# Patient Record
Sex: Male | Born: 1965 | Race: Black or African American | Hispanic: No | Marital: Married | Smoking: Former smoker
Health system: Southern US, Community
[De-identification: ages and names within clinical notes are randomized; demographics above are authoritative.]

## PROBLEM LIST (undated history)

## (undated) DIAGNOSIS — J45909 Unspecified asthma, uncomplicated: Secondary | ICD-10-CM

## (undated) HISTORY — PX: MANDIBLE FRACTURE SURGERY: SHX706

## (undated) HISTORY — PX: KNEE SURGERY: SHX244

---

## 2018-02-08 ENCOUNTER — Encounter: Payer: Self-pay | Admitting: Emergency Medicine

## 2018-02-08 ENCOUNTER — Emergency Department
Admission: EM | Admit: 2018-02-08 | Discharge: 2018-02-08 | Disposition: A | Discharge status: Home / Self Care | Payer: Managed Care, Other (non HMO) | Attending: Emergency Medicine | Admitting: Emergency Medicine

## 2018-02-08 ENCOUNTER — Emergency Department: Payer: Managed Care, Other (non HMO)

## 2018-02-08 ENCOUNTER — Other Ambulatory Visit: Payer: Self-pay

## 2018-02-08 DIAGNOSIS — J45909 Unspecified asthma, uncomplicated: Secondary | ICD-10-CM | POA: Insufficient documentation

## 2018-02-08 DIAGNOSIS — M5430 Sciatica, unspecified side: Secondary | ICD-10-CM | POA: Diagnosis present

## 2018-02-08 DIAGNOSIS — Z87891 Personal history of nicotine dependence: Secondary | ICD-10-CM | POA: Insufficient documentation

## 2018-02-08 DIAGNOSIS — M5432 Sciatica, left side: Secondary | ICD-10-CM | POA: Diagnosis not present

## 2018-02-08 HISTORY — DX: Unspecified asthma, uncomplicated: J45.909

## 2018-02-08 MED ORDER — KETOROLAC TROMETHAMINE 10 MG PO TABS: 10.0000 mg | ORAL_TABLET | Freq: Four times a day (QID) | ORAL | 0 refills | Status: AC | PRN

## 2018-02-08 MED ORDER — METHOCARBAMOL 500 MG PO TABS: ORAL_TABLET | ORAL | 0 refills | Status: AC

## 2018-02-08 MED ORDER — KETOROLAC TROMETHAMINE 30 MG/ML IJ SOLN
30.0000 mg | Freq: Once | INTRAMUSCULAR | Status: AC
  Administered 2018-02-08: 30 mg via INTRAMUSCULAR
  Filled 2018-02-08: qty 1

## 2018-02-08 MED ORDER — HYDROCODONE-ACETAMINOPHEN 5-325 MG PO TABS: 1.0000 | ORAL_TABLET | Freq: Four times a day (QID) | ORAL | 0 refills | Status: AC | PRN

## 2018-02-08 MED ORDER — HYDROCODONE-ACETAMINOPHEN 5-325 MG PO TABS
1.0000 | ORAL_TABLET | Freq: Once | ORAL | Status: AC
  Administered 2018-02-08: 1 via ORAL
  Filled 2018-02-08: qty 1

## 2018-02-08 MED ORDER — METHOCARBAMOL 500 MG PO TABS
1000.0000 mg | ORAL_TABLET | Freq: Once | ORAL | Status: AC
  Administered 2018-02-08: 1000 mg via ORAL
  Filled 2018-02-08: qty 2

## 2018-02-08 NOTE — ED Provider Notes (Signed)
[facility] Emergency Department Provider Note  ____________________________________________   First MD Initiated Contact with Patient 02/08/18 1244     (approximate)  I have reviewed the triage vital signs and the nursing notes.   HISTORY  Chief Complaint Sciatica   HPI Christopher Ortega is a 52 y.o. male presents to the ED with complaint of left leg sciatica.  Patient states he was seen at urgent care almost 2 weeks ago and was diagnosed with sciatica.  He was given steroids and Motrin and has not improved much.  He denies any injury to his back.  He denies any incontinence of bowel or bladder and continues to be ambulatory with a limp.  He states that pain is better when he is lying flat with his leg elevated.  Patient is requesting a x-ray of his back.  Currently rates his pain as 7 out of 10.   Past Medical History:  Diagnosis Date  . Asthma     There are no active problems to display for this patient.   Past Surgical History:  Procedure Laterality Date  . KNEE SURGERY    . MANDIBLE FRACTURE SURGERY      Prior to Admission medications   Medication Sig Start Date End Date Taking? Authorizing Provider  HYDROcodone-acetaminophen (NORCO/VICODIN) 5-325 MG tablet Take 1 tablet by mouth every 6 (six) hours as needed for moderate pain. 02/08/18   Tommi RumpsSummers, Yanuel Tagg L, PA-C  ketorolac (TORADOL) 10 MG tablet Take 1 tablet (10 mg total) by mouth every 6 (six) hours as needed for moderate pain. 02/08/18   Tommi RumpsSummers, Titianna Loomis L, PA-C  methocarbamol (ROBAXIN) 500 MG tablet 1 or 2 every 6 hours if needed for muscle spasms. 02/08/18   Tommi RumpsSummers, Lisa-Marie Rueger L, PA-C    Allergies Patient has no known allergies.  History reviewed. No pertinent family history.  Social History Social History   Tobacco Use  . Smoking status: Former Games developermoker  . Smokeless tobacco: Never Used  Substance Use Topics  . Alcohol use: Yes  . Drug use: Never    Review of Systems Constitutional: No  fever/chills Eyes: No visual changes. ENT: No sore throat. Cardiovascular: Denies chest pain. Respiratory: Denies shortness of breath. Gastrointestinal: No abdominal pain.  No nausea, no vomiting.  No diarrhea.  No constipation. Genitourinary: Negative for dysuria. Musculoskeletal: Positive for left leg sciatica. Skin: Negative for rash. Neurological: Negative for headaches, focal weakness or numbness. ___________________________________________   PHYSICAL EXAM:  VITAL SIGNS: ED Triage Vitals  Enc Vitals Group     BP 02/08/18 1119 (!) 155/90     Pulse Rate 02/08/18 1119 80     Resp 02/08/18 1119 18     Temp 02/08/18 1119 (!) 97.4 F (36.3 C)     Temp Source 02/08/18 1119 Oral     SpO2 02/08/18 1119 97 %     Weight --      Height --      Head Circumference --      Peak Flow --      Pain Score 02/08/18 1118 7     Pain Loc --      Pain Edu? --      Excl. in GC? --    Constitutional: Alert and oriented. Well appearing and in no acute distress. Eyes: Conjunctivae are normal.  Head: Atraumatic. Neck: No stridor.   Cardiovascular: Normal rate, regular rhythm. Grossly normal heart sounds.  Good peripheral circulation. Respiratory: Normal respiratory effort.  No retractions. Lungs CTAB. Gastrointestinal: Soft  and nontender. No distention.  Musculoskeletal: Examination of the back there is no gross deformity noted.  There is no point tenderness on palpation of the lumbar spine.  There is however moderate amount of tenderness on palpation of the left SI joint area and surrounding tissue.  Straight leg raises were negative and patient has good muscle strength bilaterally.  Patient was observed walking in the hallway and does walk with a limp favoring his left leg. Neurologic:  Normal speech and language. No gross focal neurologic deficits are appreciated. No gait instability. Skin:  Skin is warm, dry and intact. No rash noted. Psychiatric: Mood and affect are normal. Speech and  behavior are normal.  ____________________________________________   LABS (all labs ordered are listed, but only abnormal results are displayed)  Labs Reviewed - No data to display  RADIOLOGY  ED MD interpretation:   Lumbar spine x-ray is negative for acute bony injury.  Official radiology report(s): Dg Lumbar Spine 2-3 Views  Result Date: 02/08/2018 CLINICAL DATA:  Left-sided sciatica 2 days ago. EXAM: LUMBAR SPINE - 2-3 VIEW COMPARISON:  None. FINDINGS: There is no evidence of lumbar spine fracture. Alignment is normal. Intervertebral disc spaces are maintained. IMPRESSION: Negative. Electronically Signed   By: Ted Mcalpine M.D.   On: 02/08/2018 14:09    ____________________________________________   PROCEDURES  Procedure(s) performed: None  Procedures  Critical Care performed: No  ____________________________________________   INITIAL IMPRESSION / ASSESSMENT AND PLAN / ED COURSE  As part of my medical decision making, I reviewed the following data within the electronic MEDICAL RECORD NUMBER Notes from prior ED visits and [place] Controlled Substance Database  Patient presents to the ED with complaint of left leg radiculopathy that was diagnosed as sciatica nearly 2 weeks ago.  Patient has taken a course of steroids without any relief.  Patient denies any injury to his back.  X-rays confirmed that there was no bony injury.  Physical exam was benign with the exception of tenderness over the SI joint area.  Patient was observed walking with limp.  He was given Robaxin 1000 mg p.o., Toradol 30 mg IM and Norco 5/325 p.o. while in the ED.  Patient was made aware that the same medications would be prescribed and to take as directed.  He was given a note to remain out of work for the next 2 days.  He is to follow-up with his PCP for any continued problems and for further treatment.  ____________________________________________   FINAL CLINICAL IMPRESSION(S) / ED DIAGNOSES  Final  diagnoses:  Sciatica of left side without back pain     ED Discharge Orders         Ordered    methocarbamol (ROBAXIN) 500 MG tablet     02/08/18 1521    ketorolac (TORADOL) 10 MG tablet  Every 6 hours PRN     02/08/18 1521    HYDROcodone-acetaminophen (NORCO/VICODIN) 5-325 MG tablet  Every 6 hours PRN     02/08/18 1521           Note:  This document was prepared using Dragon voice recognition software and may include unintentional dictation errors.    Tommi Rumps, PA-C 02/08/18 1542    Minna Antis, MD 02/13/18 [phone]

## 2018-02-08 NOTE — ED Notes (Signed)
First Nurse Note: Pt c/o lower back pain that radiates down his left leg, pt was seen in urgent care on Friday.

## 2018-02-08 NOTE — ED Notes (Signed)
Pt states sciatica x 1 week. Went to urgent care Friday but states not getting better.

## 2018-02-08 NOTE — ED Triage Notes (Signed)
Pt went to urgent care week and half ago and was diagnosed with sciatic pain.  Was given steroids and motrin but has not improved much.  Denies loss of bowel or bladder control.  Reports when lays flat and has legs elevated pain is better.  Pt would like an xray.  Denies any weakness, numbness or tingling.

## 2018-02-08 NOTE — Discharge Instructions (Addendum)
Follow-up with your primary care provider if any continued problems and also for referral for physical therapy.  Begin taking medication only as directed.  You cannot take the hydrocodone and methocarbamol together if you plan on driving or operating machinery.  Toradol is every 6 hours with food.  This medication is the same as what you received a shot of.  If not improving you will need to have further imaging done that can be arranged through your doctor's office.

## 2020-03-14 IMAGING — CR DG LUMBAR SPINE 2-3V
1 series · 3 of 3 positions shown · non-contrast
Comparison: None.

CLINICAL DATA: Left-sided sciatica 2 days ago.

EXAM:
LUMBAR SPINE - 2-3 VIEW

[Series 1: dg lumbar spine 2-3 views · 0.14mm/px · 3 of 3 slices shown]
[im 1/3]
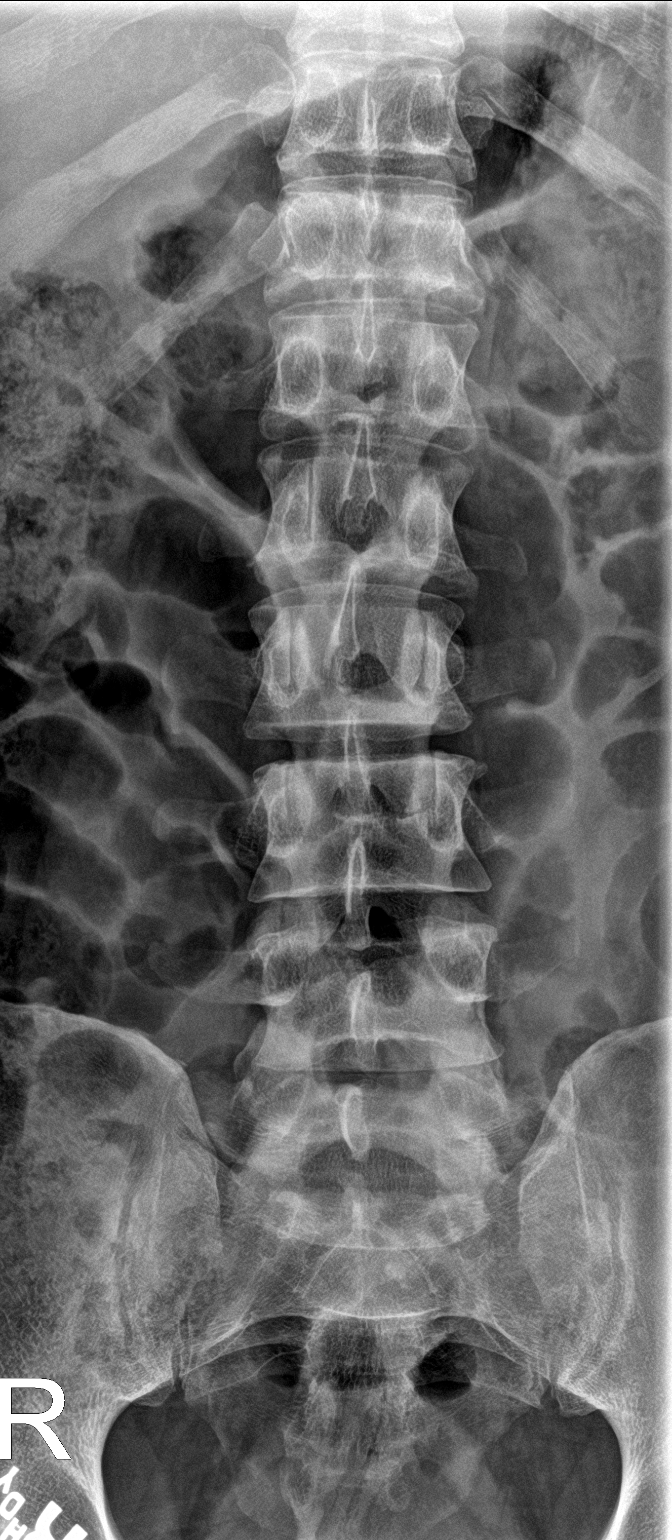
[im 2/3]
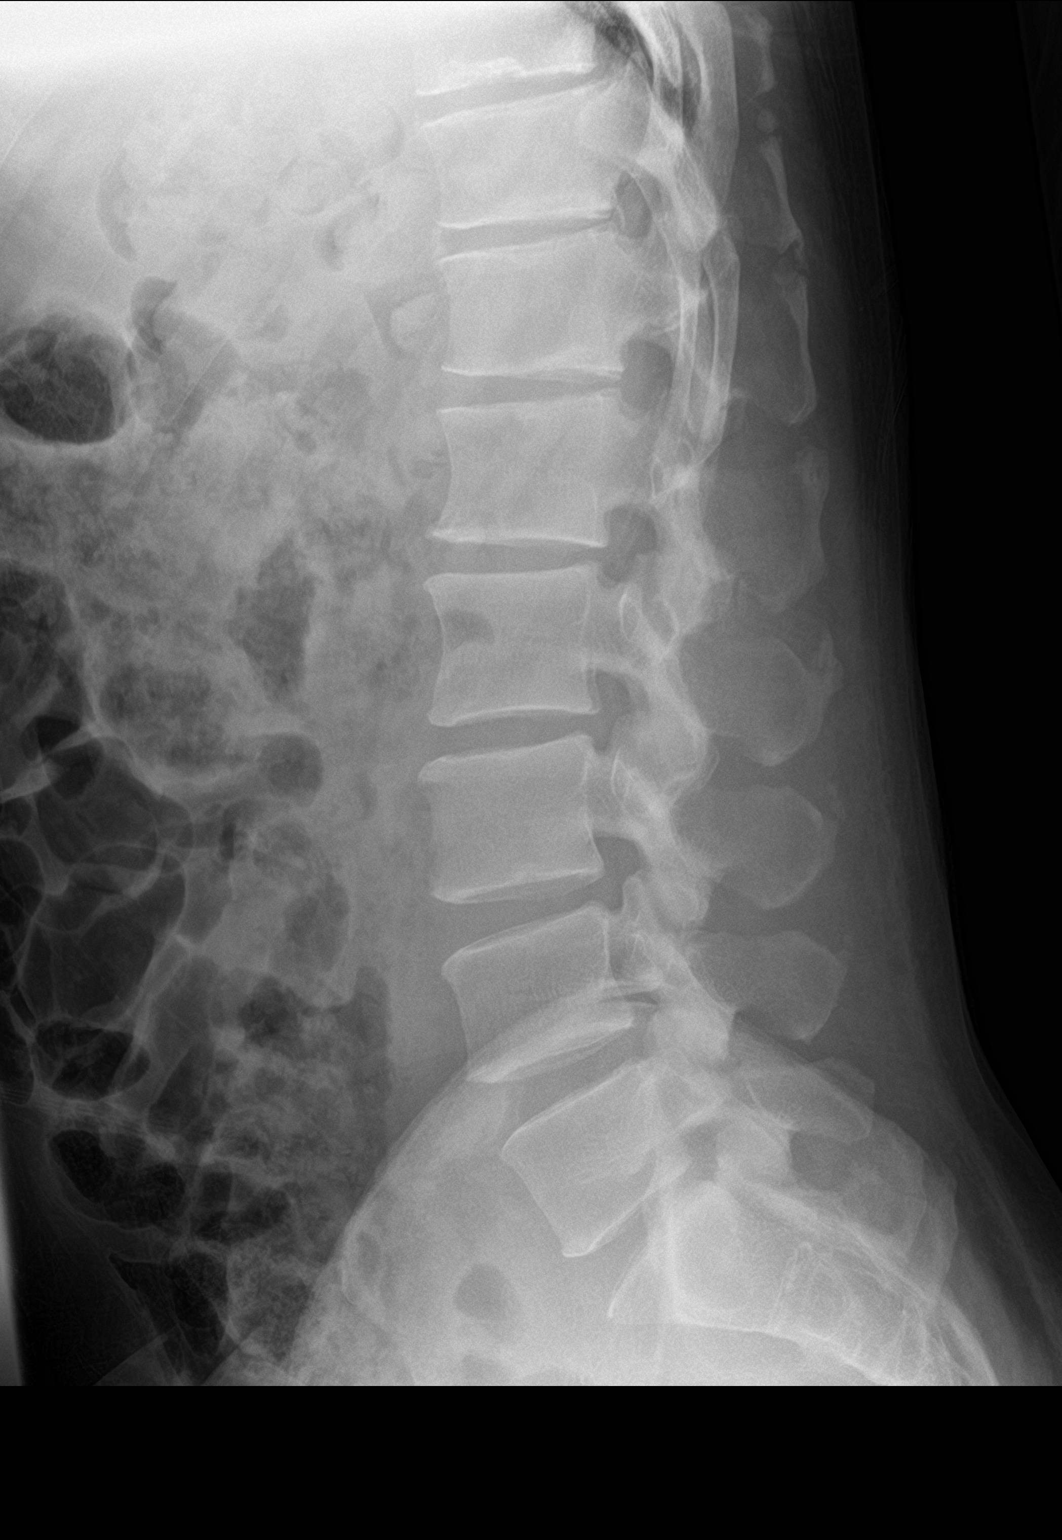
[im 3/3]
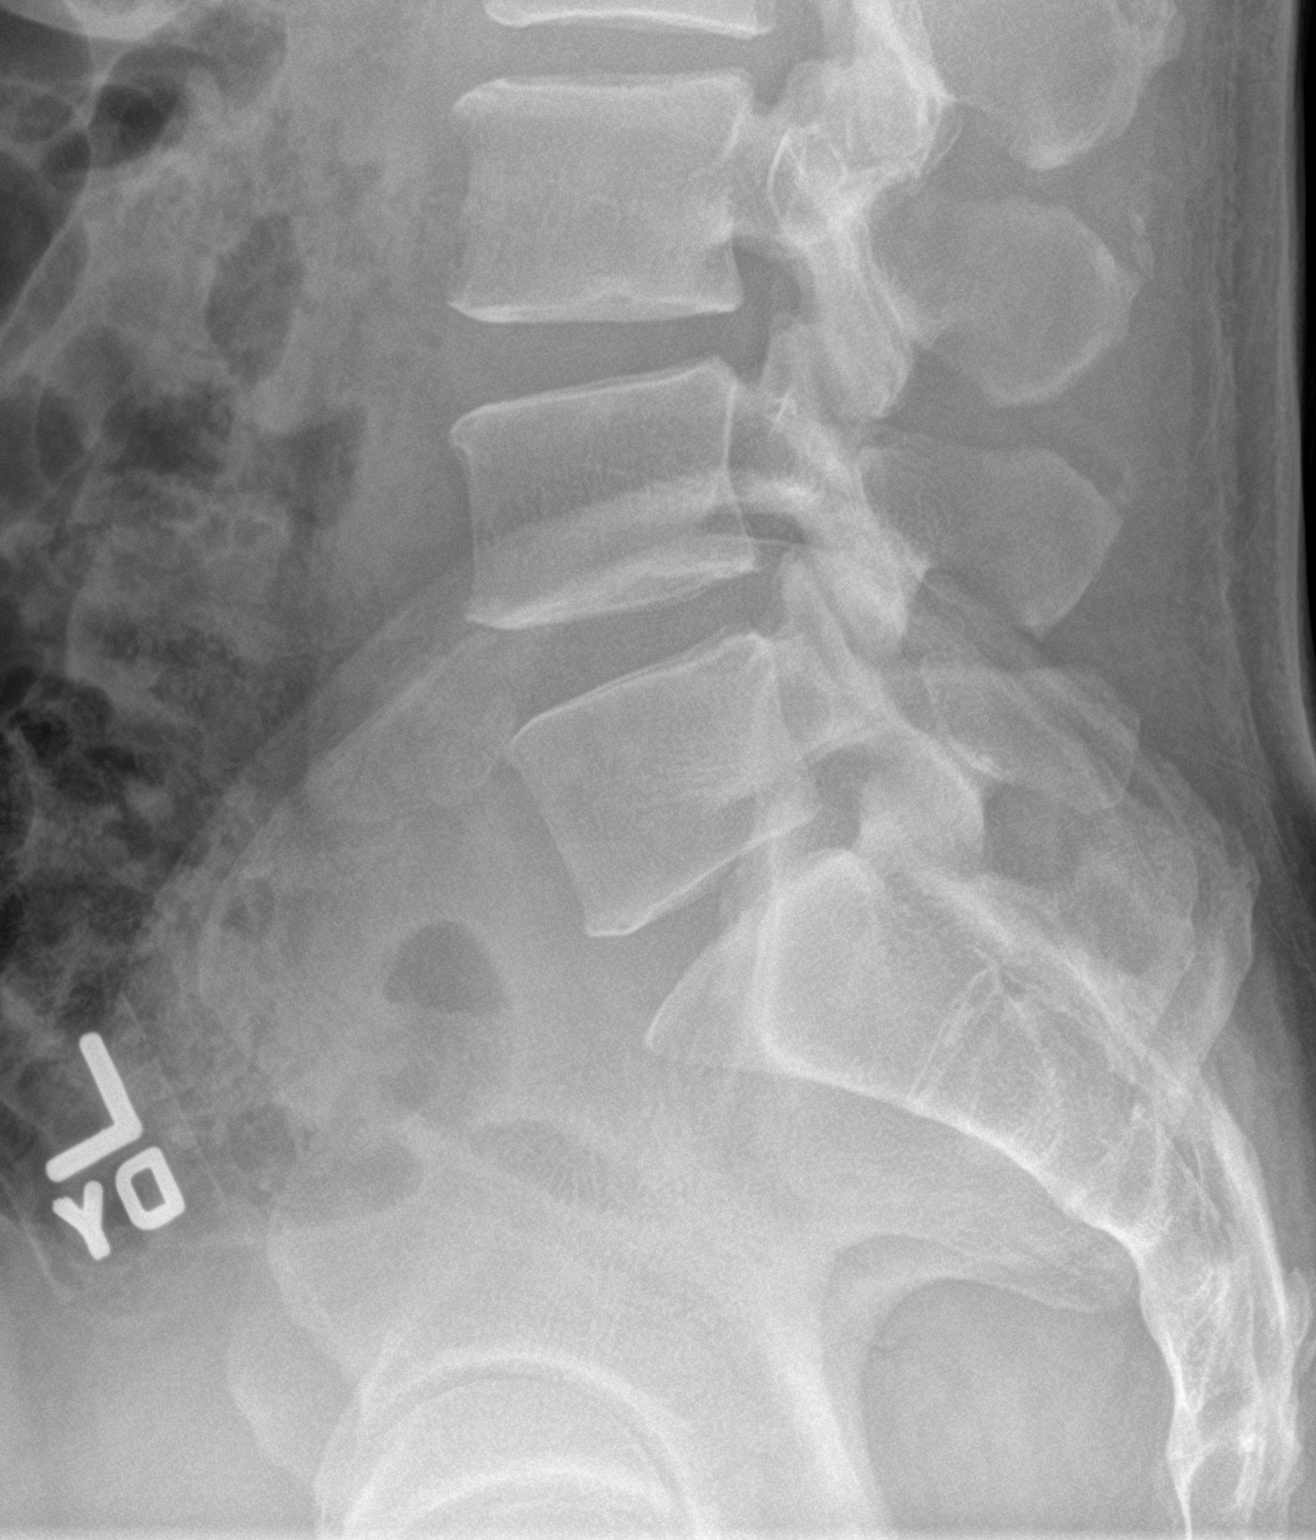

[3 of 3 positions shown; findings below may reference images not displayed]

FINDINGS: There is no evidence of lumbar spine fracture. Alignment is normal.
Intervertebral disc spaces are maintained.
IMPRESSION: Negative.
